# Patient Record
Sex: Male | Born: 1940 | Race: White | Hispanic: No | Marital: Single
Health system: Southern US, Community
[De-identification: ages and names within clinical notes are randomized; demographics above are authoritative.]

---

## 2014-04-04 ENCOUNTER — Inpatient Hospital Stay: Payer: Self-pay | Admitting: Internal Medicine

## 2014-04-04 LAB — CBC WITH DIFFERENTIAL/PLATELET
BASOS PCT: 0.3 %
Basophil #: 0 10*3/uL (ref 0.0–0.1)
EOS PCT: 0.6 %
Eosinophil #: 0.1 10*3/uL (ref 0.0–0.7)
HCT: 43.6 % (ref 40.0–52.0)
HGB: 15 g/dL (ref 13.0–18.0)
Lymphocyte #: 1 10*3/uL (ref 1.0–3.6)
Lymphocyte %: 9.5 %
MCH: 32.5 pg (ref 26.0–34.0)
MCHC: 34.4 g/dL (ref 32.0–36.0)
MCV: 95 fL (ref 80–100)
MONO ABS: 0.8 x10 3/mm (ref 0.2–1.0)
MONOS PCT: 7.7 %
Neutrophil #: 9 10*3/uL — ABNORMAL HIGH (ref 1.4–6.5)
Neutrophil %: 81.9 %
Platelet: 155 10*3/uL (ref 150–440)
RBC: 4.61 10*6/uL (ref 4.40–5.90)
RDW: 13.7 % (ref 11.5–14.5)
WBC: 10.9 10*3/uL — ABNORMAL HIGH (ref 3.8–10.6)

## 2014-04-04 LAB — BASIC METABOLIC PANEL
Anion Gap: 9 (ref 7–16)
BUN: 16 mg/dL (ref 7–18)
CALCIUM: 8.4 mg/dL — AB (ref 8.5–10.1)
Chloride: 105 mmol/L (ref 98–107)
Co2: 25 mmol/L (ref 21–32)
Creatinine: 0.92 mg/dL (ref 0.60–1.30)
EGFR (African American): >60
EGFR (Non-African Amer.): >60
Glucose: 143 mg/dL — ABNORMAL HIGH (ref 65–99)
Osmolality: 281 (ref 275–301)
Potassium: 3.3 mmol/L — ABNORMAL LOW (ref 3.5–5.1)
SODIUM: 139 mmol/L (ref 136–145)

## 2014-04-04 LAB — TROPONIN I: Troponin-I: <0.02 ng/mL

## 2014-04-05 DIAGNOSIS — I6789 Other cerebrovascular disease: Secondary | ICD-10-CM

## 2014-04-05 LAB — BASIC METABOLIC PANEL
Anion Gap: 6 — ABNORMAL LOW (ref 7–16)
BUN: 16 mg/dL (ref 7–18)
CALCIUM: 8.2 mg/dL — AB (ref 8.5–10.1)
CHLORIDE: 106 mmol/L (ref 98–107)
CO2: 30 mmol/L (ref 21–32)
Creatinine: 0.99 mg/dL (ref 0.60–1.30)
EGFR (African American): >60
EGFR (Non-African Amer.): >60
Glucose: 108 mg/dL — ABNORMAL HIGH (ref 65–99)
OSMOLALITY: 285 (ref 275–301)
Potassium: 3.1 mmol/L — ABNORMAL LOW (ref 3.5–5.1)
SODIUM: 142 mmol/L (ref 136–145)

## 2014-04-05 LAB — CBC WITH DIFFERENTIAL/PLATELET
Basophil #: 0.1 10*3/uL (ref 0.0–0.1)
Basophil %: 1.2 %
EOS ABS: 0.2 10*3/uL (ref 0.0–0.7)
Eosinophil %: 2.8 %
HCT: 41.8 % (ref 40.0–52.0)
HGB: 14.4 g/dL (ref 13.0–18.0)
Lymphocyte #: 1.8 10*3/uL (ref 1.0–3.6)
Lymphocyte %: 22.7 %
MCH: 32.7 pg (ref 26.0–34.0)
MCHC: 34.4 g/dL (ref 32.0–36.0)
MCV: 95 fL (ref 80–100)
MONOS PCT: 8.9 %
Monocyte #: 0.7 x10 3/mm (ref 0.2–1.0)
NEUTROS ABS: 5.1 10*3/uL (ref 1.4–6.5)
Neutrophil %: 64.4 %
Platelet: 142 10*3/uL — ABNORMAL LOW (ref 150–440)
RBC: 4.39 10*6/uL — AB (ref 4.40–5.90)
RDW: 13.9 % (ref 11.5–14.5)
WBC: 7.9 10*3/uL (ref 3.8–10.6)

## 2014-04-05 LAB — LIPID PANEL
Cholesterol: 155 mg/dL (ref 0–200)
HDL: 40 mg/dL (ref 40–60)
LDL CHOLESTEROL, CALC: 71 mg/dL (ref 0–100)
TRIGLYCERIDES: 218 mg/dL — AB (ref 0–200)
VLDL CHOLESTEROL, CALC: 44 mg/dL — AB (ref 5–40)

## 2014-04-05 LAB — TSH: Thyroid Stimulating Horm: 2.5 u[IU]/mL

## 2014-04-05 LAB — MAGNESIUM: Magnesium: 2.4 mg/dL

## 2014-04-06 ENCOUNTER — Ambulatory Visit: Payer: Self-pay | Admitting: Neurology

## 2014-04-06 LAB — URINALYSIS, COMPLETE
BILIRUBIN, UR: NEGATIVE
Glucose,UR: NEGATIVE mg/dL (ref 0–75)
Nitrite: NEGATIVE
PH: 6 (ref 4.5–8.0)
Protein: NEGATIVE
RBC,UR: 5 /HPF (ref 0–5)
Specific Gravity: 1.018 (ref 1.003–1.030)
Squamous Epithelial: 1

## 2014-04-06 LAB — BASIC METABOLIC PANEL
Anion Gap: 9 (ref 7–16)
BUN: 12 mg/dL (ref 7–18)
CALCIUM: 8 mg/dL — AB (ref 8.5–10.1)
Chloride: 107 mmol/L (ref 98–107)
Co2: 25 mmol/L (ref 21–32)
Creatinine: 0.84 mg/dL (ref 0.60–1.30)
EGFR (African American): >60
Glucose: 139 mg/dL — ABNORMAL HIGH (ref 65–99)
Osmolality: 283 (ref 275–301)
Potassium: 3.4 mmol/L — ABNORMAL LOW (ref 3.5–5.1)
SODIUM: 141 mmol/L (ref 136–145)

## 2014-04-07 LAB — BASIC METABOLIC PANEL
ANION GAP: 7 (ref 7–16)
BUN: 12 mg/dL (ref 7–18)
CALCIUM: 8.3 mg/dL — AB (ref 8.5–10.1)
CREATININE: 0.83 mg/dL (ref 0.60–1.30)
Chloride: 108 mmol/L — ABNORMAL HIGH (ref 98–107)
Co2: 25 mmol/L (ref 21–32)
EGFR (Non-African Amer.): >60
Glucose: 117 mg/dL — ABNORMAL HIGH (ref 65–99)
Osmolality: 280 (ref 275–301)
POTASSIUM: 3.4 mmol/L — AB (ref 3.5–5.1)
Sodium: 140 mmol/L (ref 136–145)

## 2014-04-07 LAB — URINE CULTURE
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
## 2014-11-29 NOTE — Discharge Summary (Signed)
PATIENT NAME:  Don Ward, CHO MR#:  [id number] DATE OF BIRTH:  12-14-1940  DATE OF ADMISSION:  04/04/2014 DATE OF DISCHARGE:  04/07/2014  CHIEF COMPLAINT AT THE TIME OF ADMISSION: Left leg weakness and fall.   ADMITTING DIAGNOSES: Possible stroke with left lower extremity weakness, hypokalemia.   DISCHARGE DIAGNOSES:  1. Acute right periventricular stroke with a persistent left lower extremity weakness.  2. Hypokalemia repleted.  3. Acute cystitis status post IV Rocephin. Blood cultures are contaminated. Discontinued antibiotics.  SECONDARY DISCHARGE DIAGNOSES: Alcohol dependence, benign prostatic hypertrophy, anxiety and depression.   CONSULTATIONS: Neurology.   PROCEDURES: None.   BRIEF HISTORY AND PHYSICAL AND HOSPITAL COURSE: The patient is a 74 year old male with a history of stroke,  who came into the ED with a chief complaint of left leg weakness and fall. The patient fell 3 times prior to the admission. Please review H and P for details. The patient was admitted with the possibility of stroke. MRA of the brain, carotid Dopplers, and 2D echocardiogram was ordered. Echocardiogram has revealed a 60% to 65% of ejection fraction. LDL is at 671. The patient's discontinued taking his medications prior to his admission. He is resumed on low-dose aspirin 81 mg and Plavix was resumed. MRA of the brain has revealed right periventricular stroke. Neurology consult is placed. They agree with the low-dose aspirin, Plavix, and statin. The patient was evaluated by physical therapy who has recommended subacute rehabilitation for rehabilitation. Case management was consulted and the patient is getting transferred to rehabilitation center today under stable condition.   The patient was started on IV Rocephin for possible acute cystitis as urinalysis is positive. Urine culture preliminary report has revealed mixed organisms, which is probably a contaminated specimen. The patient was afebrile during the  hospital course and the white count was at 7.9 with no leukocytosis. Antibiotics were discontinued.   Hypokalemia was repeated. Regarding alcohol abuse, the patient was placed on CIWA protocol. The patient did not go through any delirium tremens during the hospital course. Blood pressure medications were titrated as the blood pressure is uncontrolled at the time of admission. Overall hospital course was uneventful. The patient is discharge to a rehabilitation center under stable condition today.   LABORATORY DATA: Urinalysis has revealed 2+ leukocyte esterase, nitrites are negative. Culture has revealed mixed bacteria. WBC is 7.9, hemoglobin and hematocrit are normal. Platelet count is 142,000. On August 31 BUN, creatinine, sodium are normal, potassium 3.4. Forty of potassium was given. GFR greater than 60.   Ultrasound of the carotid: No significant carotid atherosclerotic valvular disease. Internal carotids are patent with antegrade flow. Echocardiogram with left ventricular ejection fraction of 60% to 65%, normal global left ventricular systolic function. No source of CVA or TIA noticed. MRA of the brain without contrast: Motion degraded examination demonstrating moderate-sized right periventricular deep white matter infarct lying within the right MCA territory. No involvement of the overlying cortex. No associated hemorrhage, chronic changes were noticed.   MEDICATIONS AT THE TIME OF DISCHARGE: Atenolol 50 mg 1 tablet p.o. once daily, Plavix 75 mg once daily, chlorthalidone 25 mg once daily, simvastatin 20 mg p.o. at bedtime, Flomax 0.4 mg p.o. once daily, citalopram 10 mg p.o. once daily, ranitidine 150 mg p.o. once a day, diazepam 5 mg 2 tablets p.o. once a day, lisinopril 40 mg once daily, aspirin 81 mg enteric-coated p.o. once daily, Colace 100 mg p.o. 2 times a day as needed for constipation.   DIET: Low-fat, low-cholesterol.   ACTIVITY: As  tolerated, as recommended by physical therapy.    FOLLOWUP: Primary care physician followup in 1 week and to follow up with neurology, Dr. Loretha BrasilZeylikman, in 2 weeks.   The patient was counseled to stop drinking alcohol. The patient will be benefited with alcohol rehabilitation center. Diagnosis and plan of care was discussed in detail with the patient and his family members at bedside.   CODE STATUS: Initially, the patient wants to be limited code, but subsequently he changed his mind and the code status is changed back to full code.   TOTAL TIME SPENT ON THE DISCHARGE: 45 minutes.     ____________________________ Ramonita LabAruna Quisha Mabie, MD ag:lt D: 04/07/2014 16:00:58 ET T: 04/07/2014 17:03:37 ET JOB#: [id number]  cc: Ramonita LabAruna Rosealie Reach, MD, <Dictator> Ramonita LabARUNA Eleanora Guinyard MD ELECTRONICALLY SIGNED 04/19/2014 15:28 Ramonita LabARUNA Naveed Humphres MD ELECTRONICALLY SIGNED 04/19/2014 16:18
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
## 2015-11-22 IMAGING — CT CT HEAD WITHOUT CONTRAST
3 series · 18 of 30 positions shown, 20 images · non-contrast
Comparison: None.

CLINICAL DATA: Fall.  Generalized weakness.  Left leg weakness.

EXAM:
CT HEAD WITHOUT CONTRAST
TECHNIQUE: Contiguous axial images were obtained from the base of the skull
through the vertex without intravenous contrast.

[Series 2: soft tissue · axial · 0.45mm/px · z∈[+368,+488]mm · 8 of 32 slices shown, 10 images]
[im 4/32  brain]
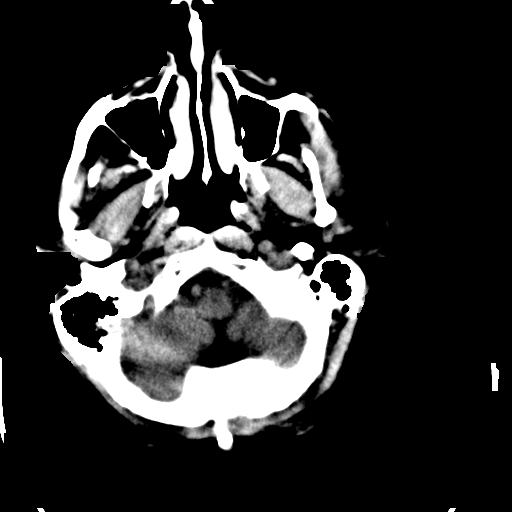
[im 4/32  bone]
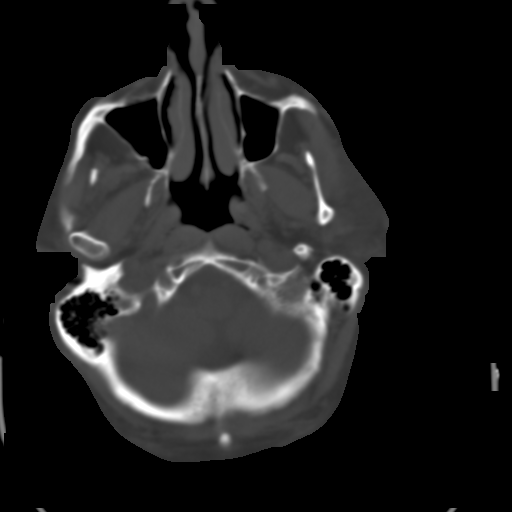
[im 7/32  brain]
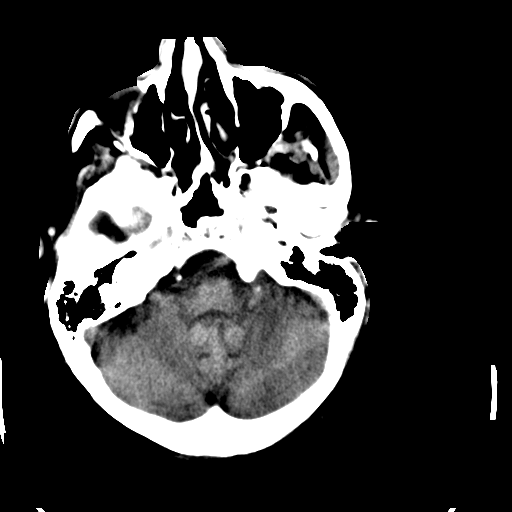
[im 11/32  brain]
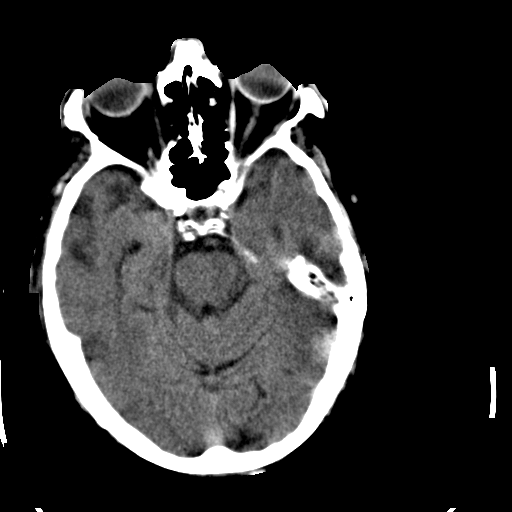
[im 14/32  brain]
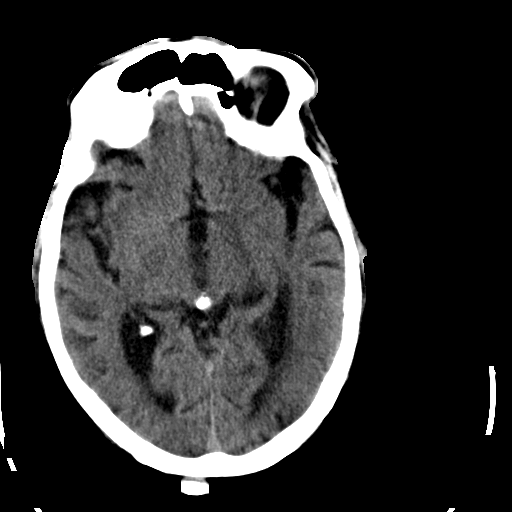
[im 18/32  brain]
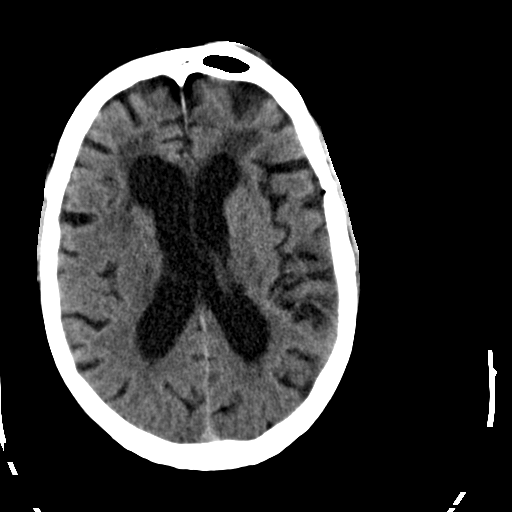
[im 18/32  bone]
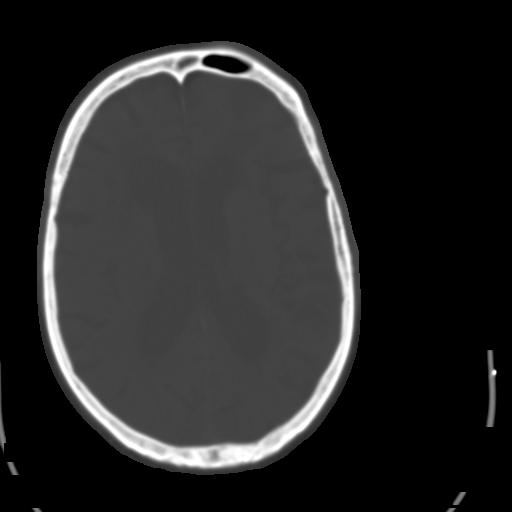
[im 21/32  brain]
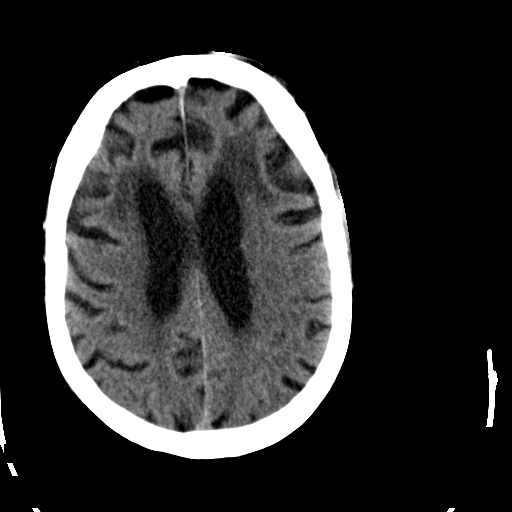
[im 25/32  brain]
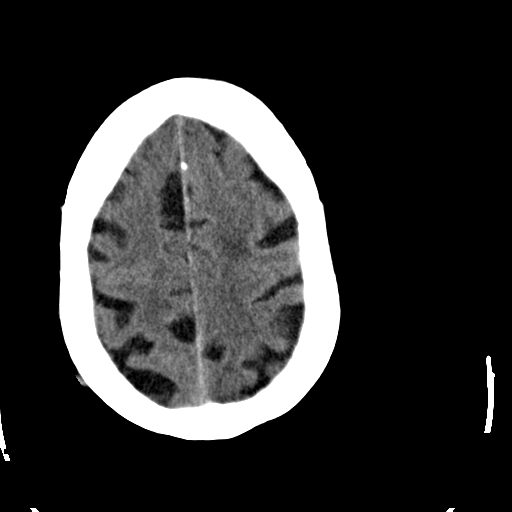
[im 28/32  brain]
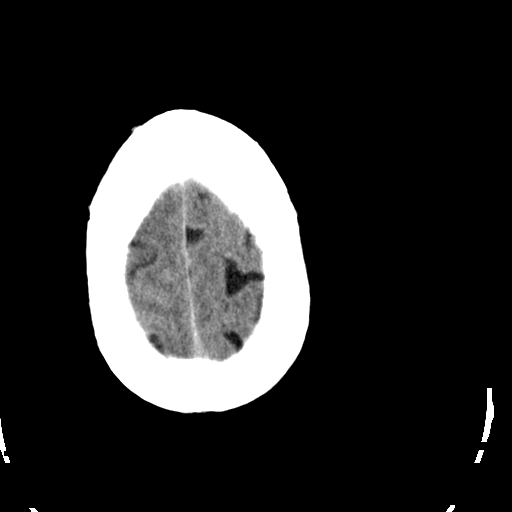

[Series 4: soft tissue recon · axial · 0.42mm/px · z∈[+388,+503]mm · 8 of 32 slices shown]
[im 4/32  brain]
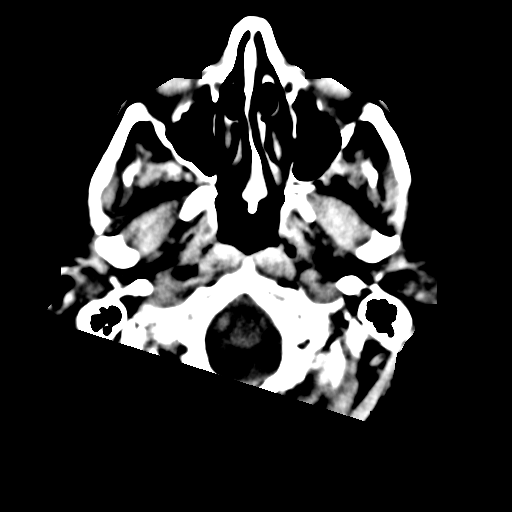
[im 7/32  brain]
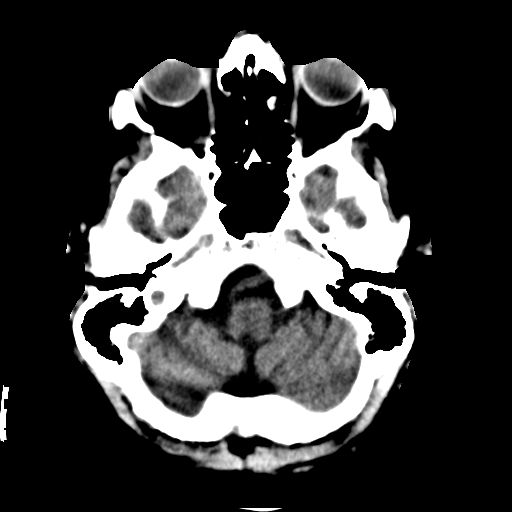
[im 11/32  brain]
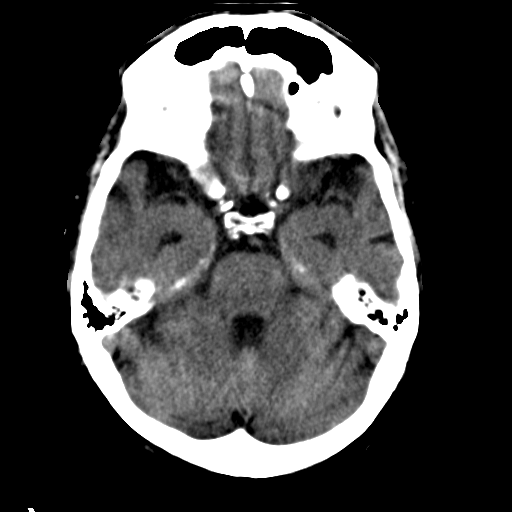
[im 14/32  brain]
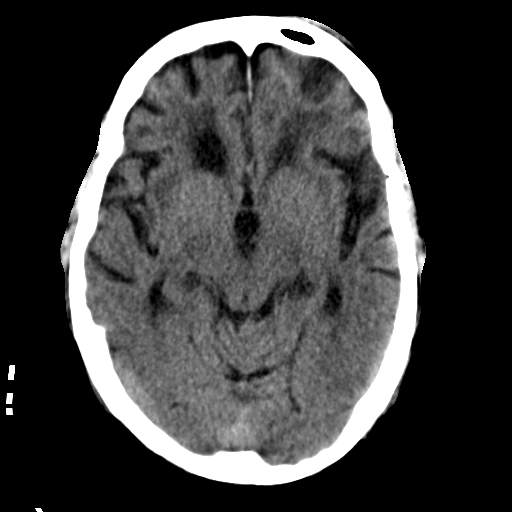
[im 18/32  brain]
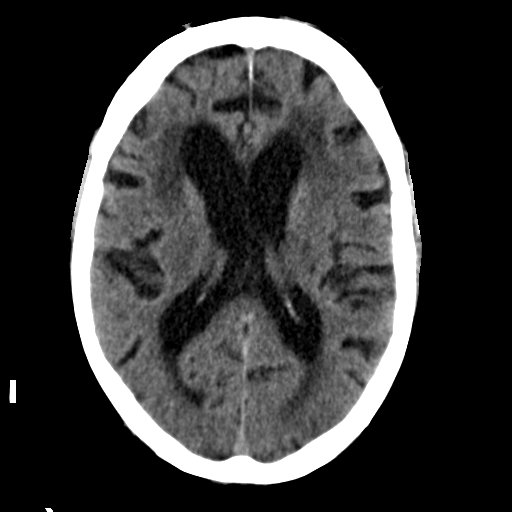
[im 21/32  brain]
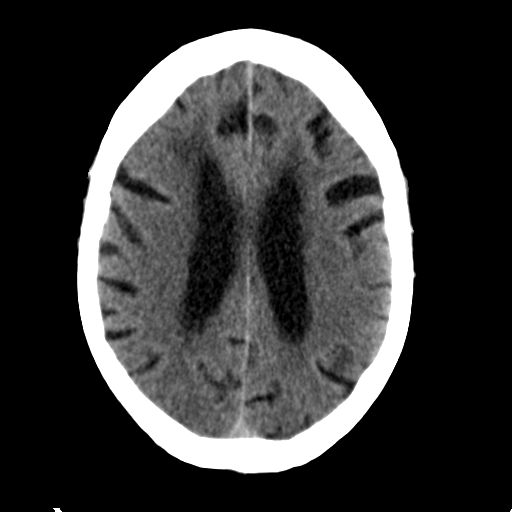
[im 25/32  brain]
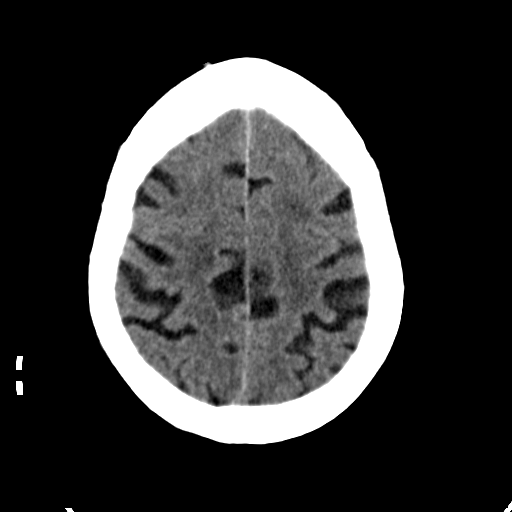
[im 28/32  brain]
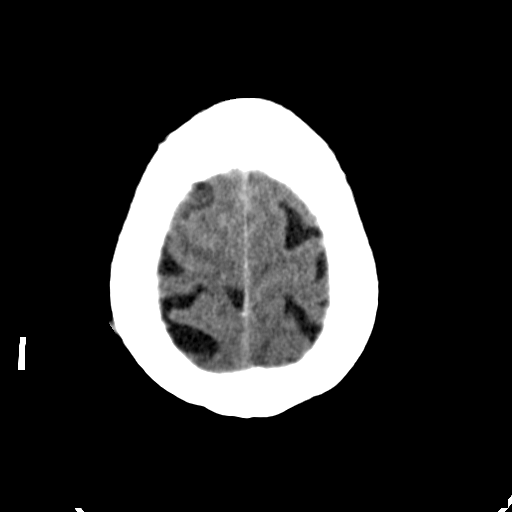

[Series 5: bone recon · axial · 0.42mm/px · z∈[+388,+407]mm · 2 of 33 slices shown]
[im 4/33  bone]
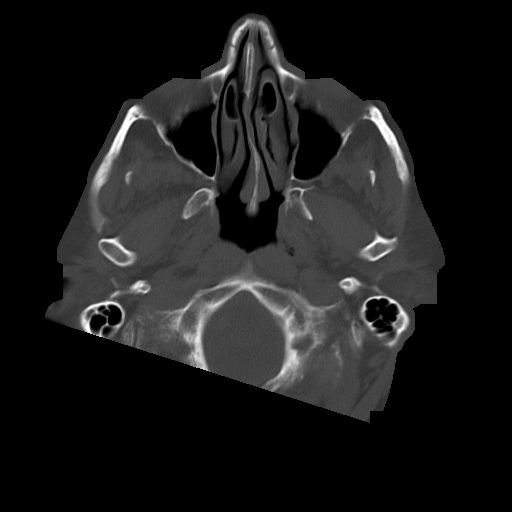
[im 8/33  bone]
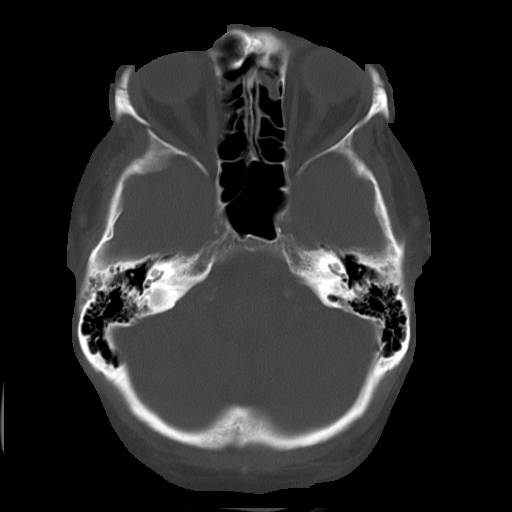

[18 of 30 positions shown; findings below may reference images not displayed]

FINDINGS: There is atrophy and chronic small vessel disease changes. Old right
basal ganglia lacunar infarct. No acute intracranial abnormality.
Specifically, no hemorrhage, hydrocephalus, mass lesion, acute
infarction, or significant intracranial injury. No acute calvarial
abnormality. Visualized paranasal sinuses and mastoids clear.
Orbital soft tissues unremarkable.
IMPRESSION: No acute intracranial abnormality.

Atrophy, chronic microvascular disease.

Old right basal ganglia lacunar infarct.
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
## 2017-12-06 DEATH — deceased
